# Patient Record
Sex: Female | Born: 2000
Health system: Southern US, Community
[De-identification: ages and names within clinical notes are randomized; demographics above are authoritative.]

## PROBLEM LIST (undated history)

## (undated) DIAGNOSIS — D649 Anemia, unspecified: Secondary | ICD-10-CM

## (undated) DIAGNOSIS — L709 Acne, unspecified: Secondary | ICD-10-CM

## (undated) DIAGNOSIS — M67432 Ganglion, left wrist: Secondary | ICD-10-CM

---
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
## 2022-07-21 ENCOUNTER — Telehealth: Payer: Self-pay | Admitting: Psychiatry

## 2022-07-21 NOTE — Telephone Encounter (Signed)
Pt's mom Izora Gala called asking is it possible for Allie to have virtual DMR apts from [place]?  Please advise. Izora Gala mom # [phone]

## 2022-09-08 ENCOUNTER — Telehealth: Payer: Self-pay | Admitting: Psychiatry

## 2022-09-08 ENCOUNTER — Ambulatory Visit: Payer: BC Managed Care – PPO | Admitting: Psychiatry

## 2022-09-08 DIAGNOSIS — F4322 Adjustment disorder with anxiety: Secondary | ICD-10-CM

## 2022-09-08 NOTE — Telephone Encounter (Signed)
Ms. Leslie [name], Leslie [name] are scheduled for a virtual visit with your provider today.    Just as we do with appointments in the office, we must obtain your consent to participate.  Your consent will be active for this visit and any virtual visit you may have with one of our providers in the next 365 days.    If you have a MyChart account, I can also send a copy of this consent to you electronically.  All virtual visits are billed to your insurance company just like a traditional visit in the office.  As this is a virtual visit, video technology does not allow for your provider to perform a traditional examination.  This may limit your provider's ability to fully assess your condition.  If your provider identifies any concerns that need to be evaluated in person or the need to arrange testing such as labs, EKG, etc, we will make arrangements to do so.    Although advances in technology are sophisticated, we cannot ensure that it will always work on either your end or our end.  If the connection with a video visit is poor, we may have to switch to a telephone visit.  With either a video or telephone visit, we are not always able to ensure that we have a secure connection.   I need to obtain your verbal consent now.   Are you willing to proceed with your visit today?   Leslie [name] has provided verbal consent on 09/08/2022 for a virtual visit (video or telephone).   Lina Sayre, [facility] 09/08/2022  12:01 PM

## 2022-09-08 NOTE — Progress Notes (Signed)
[facility] Counselor/Therapist Progress Note  Patient ID: Leslie [name], MRN: [id number],    Date: 09/08/2022  Time Spent: 50 minutes start time 12:00 end time 12:50 PM Virtual Visit via Video Note Connected with patient by a telemedicine/telehealth application, with their informed consent, and verified patient privacy and that I am speaking with the correct person using two identifiers. I discussed the limitations, risks, security and privacy concerns of performing psychotherapy and the availability of in person appointments. I also discussed with the patient that there may be a patient responsible charge related to this service. The patient expressed understanding and agreed to proceed. I discussed the treatment planning with the patient. The patient was provided an opportunity to ask questions and all were answered. The patient agreed with the plan and demonstrated an understanding of the instructions. The patient was advised to call  our office if  symptoms worsen or feel they are in a crisis state and need immediate contact.   Therapist Location: office Patient Location: home    Treatment Type: Individual Therapy  Reported Symptoms: anxiety, triggered responses,focusing issues,  Mental Status Exam:  Appearance:   Casual     Behavior:  Appropriate  Motor:  Normal  Speech/Language:   Normal Rate  Affect:  Appropriate  Mood:  anxious  Thought process:  normal  Thought content:    WNL  Sensory/Perceptual disturbances:    WNL  Orientation:  oriented to person, place, time/date, and situation  Attention:  Good  Concentration:  Good  Memory:  WNL  Fund of knowledge:   Good  Insight:    Good  Judgment:   Good  Impulse Control:  Good   Risk Assessment: Danger to Self:  No Self-injurious Behavior: No Danger to Others: No Duty to Warn:no Physical Aggression / Violence:No  Access to Firearms a concern: No  Gang Involvement:No   Subjective: Met with patient via  virtual session. She shared she is continuing to have issues with blood  but it is better than a year ago.  She also witnessed somebody on campus having a seizure and that was disturbing for her.  She is having an issue with [name]. She shared that recently a group of friend has left her and she is not sure why that happened.  Patient was given time to discuss that situation and other deceased situations through her sharing it seemed that patient had not handled anything inappropriately it was just the people she was interacting with due to different situations or their inappropriate responses friendships had ended.  Patient was taught more grounding techniques to practice over the next few days.  Patient is going to do a processing set on the blood again at next session.  Interventions: Solution-Oriented/Positive Psychology  Diagnosis:   ICD-10-CM   1. Adjustment disorder with anxious mood  F43.22       Plan:  Patient is to use coping skills to decrease anxiety symptoms.  Patient is to work on reminding herself that she is stressed if she feels pain in her neck or her ribs and practice meditation to get herself calm.  Patient is to watch Dr. Tawanna Solo on YouTube.  Patient is to let nurses know that she is then things that she has to have blood drawn.  Patient is to have someone around her if she is to take any medication.  Patient is to exercise to release negative emotions appropriately. Long term goal Resolve core conflict that is the source of anxiety  Short term goal: Identify the major life conflicts from past and present that form the basis for the present anxiety  Leslie [name], [facility]

## 2022-09-10 ENCOUNTER — Ambulatory Visit: Payer: BC Managed Care – PPO | Admitting: Psychiatry

## 2022-09-10 DIAGNOSIS — F4322 Adjustment disorder with anxiety: Secondary | ICD-10-CM

## 2022-09-10 NOTE — Progress Notes (Signed)
      [facility] Counselor/Therapist Progress Note  Patient ID: Leslie [name], MRN: [id number],    Date: 09/10/2022  Time Spent: 50 minutes start time 2:10 PM End time 3:00 PM Virtual Visit via Video Note Connected with patient by a telemedicine/telehealth application, with their informed consent, and verified patient privacy and that I am speaking with the correct person using two identifiers. I discussed the limitations, risks, security and privacy concerns of performing psychotherapy and the availability of in person appointments. I also discussed with the patient that there may be a patient responsible charge related to this service. The patient expressed understanding and agreed to proceed. I discussed the treatment planning with the patient. The patient was provided an opportunity to ask questions and all were answered. The patient agreed with the plan and demonstrated an understanding of the instructions. The patient was advised to call  our office if  symptoms worsen or feel they are in a crisis state and need immediate contact.   Therapist Location: office Patient Location: home    Treatment Type: Individual Therapy  Reported Symptoms: anxiety, triggered responses  Mental Status Exam:  Appearance:   Well Groomed     Behavior:  Appropriate  Motor:  Normal  Speech/Language:   Normal Rate  Affect:  Appropriate  Mood:  anxious  Thought process:  normal  Thought content:    WNL  Sensory/Perceptual disturbances:    WNL  Orientation:  oriented to person, place, time/date, and situation  Attention:  Good  Concentration:  Good  Memory:  WNL  Fund of knowledge:   Good  Insight:    Good  Judgment:   Good  Impulse Control:  Good   Risk Assessment: Danger to Self:  No Self-injurious Behavior: No Danger to Others: No Duty to Warn:no Physical Aggression / Violence:No  Access to Firearms a concern: No  Gang Involvement:No   Subjective: Met with patient via virtual  session. She shared that things were going about the same and she was ready to deal with her Blood and seizures. Pitcure her upset over thinking about blood, SUDS level 6 negative cognition "I'm going to pass out in class" felt anxiety in her head and ribs.  Patient was able to resolve the set and reduce SUDS to 2.  She was able to work on affirmations that she can remind herself.  Interventions: Eye Movement Desensitization and Reprocessing (EMDR), Insight-Oriented, and BSP  Diagnosis:   ICD-10-CM   1. Adjustment disorder with anxious mood  F43.22       Plan:   Patient is to use coping skills to decrease anxiety symptoms.  Patient is to work on reminding herself that she is stressed if she feels pain in her neck or her ribs and practice meditation to get herself calm.  Patient is to watch Dr. Tawanna Solo on YouTube.  Patient is to let nurses know that she is then things that she has to have blood drawn.  Patient is to have someone around her if she is to take any medication.  Patient is to exercise to release negative emotions appropriately. Long term goal Resolve core conflict that is the source of anxiety Short term goal: Identify the major life conflicts from past and present that form the basis for the present anxiety  Lina Sayre, [facility]

## 2022-10-06 ENCOUNTER — Ambulatory Visit (INDEPENDENT_AMBULATORY_CARE_PROVIDER_SITE_OTHER): Payer: BC Managed Care – PPO | Admitting: Psychiatry

## 2022-10-06 DIAGNOSIS — F4322 Adjustment disorder with anxiety: Secondary | ICD-10-CM

## 2022-10-06 NOTE — Progress Notes (Signed)
[facility] Counselor/Therapist Progress Note  Patient ID: Leslie [name], MRN: [id number],    Date: 10/06/2022  Time Spent: 50 minutes start time 9:01 AM end time 9:51 AM Virtual Visit via Video Note Connected with patient by a telemedicine/telehealth application, with their informed consent, and verified patient privacy and that I am speaking with the correct person using two identifiers. I discussed the limitations, risks, security and privacy concerns of performing psychotherapy and the availability of in person appointments. I also discussed with the patient that there may be a patient responsible charge related to this service. The patient expressed understanding and agreed to proceed. I discussed the treatment planning with the patient. The patient was provided an opportunity to ask questions and all were answered. The patient agreed with the plan and demonstrated an understanding of the instructions. The patient was advised to call  our office if  symptoms worsen or feel they are in a crisis state and need immediate contact.   Therapist Location: home Patient Location: dorm    Treatment Type: Individual Therapy  Reported Symptoms: anxiety, panic  Mental Status Exam:  Appearance:   Well Groomed     Behavior:  Appropriate  Motor:  Normal  Speech/Language:   Normal Rate  Affect:  Appropriate  Mood:  anxious  Thought process:  normal  Thought content:    WNL  Sensory/Perceptual disturbances:    WNL  Orientation:  oriented to person, place, time/date, and situation  Attention:  Good  Concentration:  Good  Memory:  WNL  Fund of knowledge:   Good  Insight:    Good  Judgment:   Good  Impulse Control:  Good   Risk Assessment: Danger to Self:  No Self-injurious Behavior: No Danger to Others: No Duty to Warn:no Physical Aggression / Violence:No  Access to Firearms a concern: No  Gang Involvement:No   Subjective: Patient met with in a virtual session. She shared  that she is having issues with getting triggered by heart papitation. She shared she is doing better with seizures which is good.  She has not seen lots of blood so she is not sure about that. She went on to share that the heart palpations have been the thing that has upset her the most. Did processing set on the palpations SUDS level 8 negative cognition "I'm going to have a heart attack" felt anxiety in her ribs. She was able to realize through  the processing that some messages that she has gotten over the years are impacting her and she is hoping with some answers to questions about her POTS that she will feel better as well. Patient is to continue using coing skills that are heling her when the panic and anxiety surfaces.  Interventions: Solution-Oriented/Positive Psychology, Insight-Oriented, and Brainspotting  Diagnosis:   ICD-10-CM   1. Adjustment disorder with anxious mood  F43.22       Plan:   Patient is to use coping skills to decrease anxiety symptoms.  Patient is to work on reminding herself that she is stressed if she feels pain in her neck or her ribs and practice meditation to get herself calm.  Patient is to watch Dr. De Burrs on YouTube.  Patient is to let nurses know that she is then things that she has to have blood drawn.  Patient is to have someone around her if she is to take any medication.  Patient is to exercise to release negative emotions appropriately. Long term goal Resolve core  conflict that is the source of anxiety Short term goal: Identify the major life conflicts from past and present that form the basis for the present anxiety  Stevphen Meuse, [facility]

## 2022-10-28 ENCOUNTER — Ambulatory Visit (INDEPENDENT_AMBULATORY_CARE_PROVIDER_SITE_OTHER): Payer: BC Managed Care – PPO | Admitting: Psychiatry

## 2022-10-28 DIAGNOSIS — F4322 Adjustment disorder with anxiety: Secondary | ICD-10-CM

## 2022-10-28 NOTE — Progress Notes (Signed)
      [facility] Counselor/Therapist Progress Note  Patient ID: Leslie [name], MRN: [id number],    Date: 10/28/2022  Time Spent: 51 minutes start time 10:05 AM end time 10:56 AM  Treatment Type: Individual Therapy  Reported Symptoms: anxiety, triggered responses, obsessive thinking  Mental Status Exam:  Appearance:   Casual     Behavior:  Appropriate  Motor:  Normal  Speech/Language:   Normal Rate  Affect:  Appropriate  Mood:  anxious  Thought process:  normal  Thought content:    WNL  Sensory/Perceptual disturbances:    WNL  Orientation:  oriented to person, place, time/date, and situation  Attention:  Good  Concentration:  Good  Memory:  WNL  Fund of knowledge:   Good  Insight:    Good  Judgment:   Good  Impulse Control:  Good   Risk Assessment: Danger to Self:  no Self-injurious Behavior: No Danger to Others: No Duty to Warn:no Physical Aggression / Violence:No  Access to Firearms a concern: No  Gang Involvement:No   Subjective: Patient was present for session. She shared she was doing okay until a week ago.  She went on to share that she ate at a new restaurant and she seemed to respond as an allergic reaction. She shared she doesn't know why she would have any food allergies and that has been triggering for her.  Discussed the situation and patient decided at this point there were a few things that she ate that more normal for her so she may go back and eat something different just to see what happens and not eat the sauces that she typically would not eat.  Patient stated she is going to have to get her blood drawn and wants to work on that issue since she has passed out in the past getting her blood drawn.  Did processing set on passing out when getting blood drawn, suds level 7, negative cognition, "everyone will see me" felt sadness in her eyes.  Patient was able to reduce suds level to 3.  She was able to identify a long history of feeling pressured to have  everything perfect because people were looking and counting at her.  Patient was able to see that she has the most issues with incidents that happened in front of people.  Patient was encouraged to remind herself regularly that things do not have to be perfect.  Interventions: Eye Movement Desensitization and Reprocessing (EMDR) and Insight-Oriented  Diagnosis:   ICD-10-CM   1. Adjustment disorder with anxious mood  F43.22       Plan:  Patient is to use coping skills to decrease anxiety symptoms.  Patient is to work on reminding herself that she does not have to be perfect.  Patient is to watch Dr. De Burrs on YouTube.  Patient is to let nurses know that she is then things that she has to have blood drawn.  Patient is to have someone around her if she is to take any medication.  Patient is to exercise to release negative emotions appropriately. Long term goal Resolve core conflict that is the source of anxiety Short term goal: Identify the major life conflicts from past and present that form the basis for the present anxiety  Stevphen Meuse, [facility]

## 2022-11-11 ENCOUNTER — Ambulatory Visit (INDEPENDENT_AMBULATORY_CARE_PROVIDER_SITE_OTHER): Payer: BC Managed Care – PPO | Admitting: Psychiatry

## 2022-11-11 DIAGNOSIS — F4322 Adjustment disorder with anxiety: Secondary | ICD-10-CM

## 2022-11-11 NOTE — Progress Notes (Signed)
      [facility] Counselor/Therapist Progress Note  Patient ID: Leslie [name], MRN: [id number],    Date: 11/11/2022  Time Spent: 56 minutes start time 11:03 AM end time 11:59 AM  Treatment Type: Individual Therapy  Reported Symptoms: anxiety, triggered responses  Mental Status Exam:  Appearance:   Casual     Behavior:  Appropriate  Motor:  Normal  Speech/Language:   Normal Rate  Affect:  Appropriate  Mood:  anxious  Thought process:  normal  Thought content:    WNL  Sensory/Perceptual disturbances:    WNL  Orientation:  oriented to person, place, time/date, and situation  Attention:  Good  Concentration:  Good  Memory:  WNL  Fund of knowledge:   Good  Insight:    Good  Judgment:   Good  Impulse Control:  Good   Risk Assessment: Danger to Self:  No Self-injurious Behavior: No Danger to Others: No Duty to Warn:no Physical Aggression / Violence:No  Access to Firearms a concern: No  Gang Involvement:No   Subjective: patient reported she was doing okay overall.  She has not had any more incidents of passing out.  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  Patient discussed the situation and recognize she is not sure she wants to take it or not at this time she plans on communicating with them more and asking the questions that had surfaced for her.  Patient did processing set on the unknown outcome of the medication that is triggering for her.  Suds level 8, negative cognition "I am going to die" felt anxiety and panic in her head and neck.  Patient was able to reduce suds level to 4.  Patient was able to recognize that she can be her own advocate she has had experiences in the past that have let her know that she needs to trust herself and just because she is prescribed the medication she does not have to take it if she does not feel comfortable.  Interventions: Eye Movement  Desensitization and Reprocessing (EMDR) and Insight-Oriented  Diagnosis:   ICD-10-CM   1. Adjustment disorder with anxious mood  F43.22       Plan:  Patient is to use coping skills to decrease anxiety symptoms.  Patient is to work on reminding herself that she does not have to be perfect and she can be her own advocate and make choices for herself.  Patient is to watch Dr. De Burrs on YouTube.  Patient is to let nurses know that she is then things that she has to have blood drawn.  Patient is to have someone around her if she is to take any medication.  Patient is to exercise to release negative emotions appropriately. Long term goal Resolve core conflict that is the source of anxiety Short term goal: Identify the major life conflicts from past and present that form the basis for the present anxiety  Stevphen Meuse, [facility]

## 2022-11-25 ENCOUNTER — Ambulatory Visit: Payer: BC Managed Care – PPO | Admitting: Psychiatry

## 2022-11-25 DIAGNOSIS — F4322 Adjustment disorder with anxiety: Secondary | ICD-10-CM
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
